# Patient Record
Sex: Male | Born: 1955 | Race: White | Hispanic: No | Smoking: Former smoker
Health system: Southern US, Community
[De-identification: ages and names within clinical notes are randomized; demographics above are authoritative.]

## PROBLEM LIST (undated history)

## (undated) DIAGNOSIS — G4730 Sleep apnea, unspecified: Secondary | ICD-10-CM

## (undated) DIAGNOSIS — I10 Essential (primary) hypertension: Secondary | ICD-10-CM

## (undated) DIAGNOSIS — E079 Disorder of thyroid, unspecified: Secondary | ICD-10-CM

## (undated) DIAGNOSIS — J45909 Unspecified asthma, uncomplicated: Secondary | ICD-10-CM

## (undated) DIAGNOSIS — E119 Type 2 diabetes mellitus without complications: Secondary | ICD-10-CM

## (undated) DIAGNOSIS — F1910 Other psychoactive substance abuse, uncomplicated: Secondary | ICD-10-CM

## (undated) DIAGNOSIS — Z9109 Other allergy status, other than to drugs and biological substances: Secondary | ICD-10-CM

## (undated) HISTORY — DX: Unspecified asthma, uncomplicated: J45.909

## (undated) HISTORY — DX: Disorder of thyroid, unspecified: E07.9

## (undated) HISTORY — DX: Essential (primary) hypertension: I10

## (undated) HISTORY — DX: Sleep apnea, unspecified: G47.30

## (undated) HISTORY — DX: Other allergy status, other than to drugs and biological substances: Z91.09

## (undated) HISTORY — DX: Type 2 diabetes mellitus without complications: E11.9

## (undated) HISTORY — DX: Other psychoactive substance abuse, uncomplicated: F19.10

## (undated) HISTORY — PX: ROTATOR CUFF REPAIR: SHX139

---

## 2005-03-30 ENCOUNTER — Observation Stay (HOSPITAL_COMMUNITY): Admission: RE | Admit: 2005-03-30 | Discharge: 2005-03-31 | Payer: Self-pay | Admitting: Orthopedic Surgery

## 2016-06-27 ENCOUNTER — Encounter: Payer: Self-pay | Admitting: Internal Medicine

## 2016-07-26 ENCOUNTER — Ambulatory Visit (AMBULATORY_SURGERY_CENTER): Payer: Self-pay

## 2016-07-26 VITALS — Ht 66.0 in | Wt 181.6 lb

## 2016-07-26 DIAGNOSIS — Z1211 Encounter for screening for malignant neoplasm of colon: Secondary | ICD-10-CM

## 2016-07-26 NOTE — Progress Notes (Signed)
No allergies to eggs or soy No past problems with anesthesia No diet meds No home oxygen  Has email and internet registered for emmi 

## 2016-07-31 ENCOUNTER — Encounter: Payer: Self-pay | Admitting: Internal Medicine

## 2016-08-08 ENCOUNTER — Telehealth: Payer: Self-pay | Admitting: Internal Medicine

## 2016-08-08 NOTE — Telephone Encounter (Signed)
No charge. 

## 2016-08-09 ENCOUNTER — Encounter: Payer: BLUE CROSS/BLUE SHIELD | Admitting: Internal Medicine

## 2016-10-17 ENCOUNTER — Ambulatory Visit (AMBULATORY_SURGERY_CENTER): Payer: BLUE CROSS/BLUE SHIELD | Admitting: Internal Medicine

## 2016-10-17 ENCOUNTER — Encounter: Payer: Self-pay | Admitting: Internal Medicine

## 2016-10-17 VITALS — BP 129/66 | HR 82 | Temp 97.3°F | Resp 21 | Ht 66.0 in | Wt 181.0 lb

## 2016-10-17 DIAGNOSIS — Z1211 Encounter for screening for malignant neoplasm of colon: Secondary | ICD-10-CM | POA: Diagnosis not present

## 2016-10-17 MED ORDER — SODIUM CHLORIDE 0.9 % IV SOLN: 500.0000 mL | INTRAVENOUS | Status: AC

## 2016-10-17 NOTE — Patient Instructions (Addendum)
No polyps or cancer identified. You do have diverticulosis - thickened muscle rings and pouches in the colon wall. Please read the handout about this condition.  Next routine colonoscopy/screening test in 10 years - 2027-8  I appreciate the opportunity to care for you. Iva Booparl E. Xion Debruyne, MD, FACG     YOU HAD AN ENDOSCOPIC PROCEDURE TODAY AT THE [facility]:   Refer to the procedure report that was given to you for any specific questions about what was found during the examination.  If the procedure report does not answer your questions, please call your gastroenterologist to clarify.  If you requested that your care partner not be given the details of your procedure findings, then the procedure report has been included in a sealed envelope for you to review at your convenience later.  YOU SHOULD EXPECT: Some feelings of bloating in the abdomen. Passage of more gas than usual.  Walking can help get rid of the air that was put into your GI tract during the procedure and reduce the bloating. If you had a lower endoscopy (such as a colonoscopy or flexible sigmoidoscopy) you may notice spotting of blood in your stool or on the toilet paper. If you underwent a bowel prep for your procedure, you may not have a normal bowel movement for a few days.  Please Note:  You might notice some irritation and congestion in your nose or some drainage.  This is from the oxygen used during your procedure.  There is no need for concern and it should clear up in a day or so.  SYMPTOMS TO REPORT IMMEDIATELY:   Following lower endoscopy (colonoscopy or flexible sigmoidoscopy):  Excessive amounts of blood in the stool  Significant tenderness or worsening of abdominal pains  Swelling of the abdomen that is new, acute  Fever of 100F or higher   Following upper endoscopy (EGD)  Vomiting of blood or coffee ground material  New chest pain or pain under the shoulder blades  Painful or persistently  difficult swallowing  New shortness of breath  Fever of 100F or higher  Black, tarry-looking stools  For urgent or emergent issues, a gastroenterologist can be reached at any hour by calling [phone].   DIET:  We do recommend a small meal at first, but then you may proceed to your regular diet.  Drink plenty of fluids but you should avoid alcoholic beverages for 24 hours.  ACTIVITY:  You should plan to take it easy for the rest of today and you should NOT DRIVE or use heavy machinery until tomorrow (because of the sedation medicines used during the test).    FOLLOW UP: Our staff will call the number listed on your records the next business day following your procedure to check on you and address any questions or concerns that you may have regarding the information given to you following your procedure. If we do not reach you, we will leave a message.  However, if you are feeling well and you are not experiencing any problems, there is no need to return our call.  We will assume that you have returned to your regular daily activities without incident.  If any biopsies were taken you will be contacted by phone or by letter within the next 1-3 weeks.  Please call us at [phone] if you have not heard about the biopsies in 3 weeks.    SIGNATURES/CONFIDENTIALITY: You and/or your care partner have signed paperwork which will be entered into  your electronic medical record.  These signatures attest to the fact that that the information above on your After Visit Summary has been reviewed and is understood.  Full responsibility of the confidentiality of this discharge information lies with you and/or your care-partner.   Handout was given to your care partner on diverticulosis. You may resume your current medications today. Repeat next screening colonoscopy in 10 years. Please call if any questions or concerns.

## 2016-10-17 NOTE — Progress Notes (Signed)
Oncall Note Patient called at 7 am, he didn't wake up at 3:30 AM, overslept and missed drinking the 2nd half of prep. He is scheduled for 8:30 am. He drank the first half as recommended and is having liquid light brown stool.  Advised him to come in this AM, may need enema prior to the procedure K. Scherry RanVeena Nandigam , MD [phone] Mon-Fri 8a-5p [phone] after 5p, weekends, holidays

## 2016-10-17 NOTE — Op Note (Signed)
[facility] Patient Name: Marvin Tucker Procedure Date: 10/17/2016 7:40 AM MRN: [id number] Endoscopist: Iva Booparl E Gessner , MD Age: 60 Referring MD:  Date of Birth: 14-Sep-1956 Gender: Male Account #: [id number] Procedure:                Colonoscopy Indications:              Screening for colorectal malignant neoplasm Medicines:                Monitored Anesthesia Care Procedure:                Pre-Anesthesia Assessment:                           - Prior to the procedure, a History and Physical                            was performed, and patient medications and                            allergies were reviewed. The patient's tolerance of                            previous anesthesia was also reviewed. The risks                            and benefits of the procedure and the sedation                            options and risks were discussed with the patient.                            All questions were answered, and informed consent                            was obtained. Prior Anticoagulants: The patient has                            taken no previous anticoagulant or antiplatelet                            agents. ASA Grade Assessment: II - A patient with                            mild systemic disease. After reviewing the risks                            and benefits, the patient was deemed in                            satisfactory condition to undergo the procedure.                           After obtaining informed consent, the colonoscope  was passed under direct vision. Throughout the                            procedure, the patient's blood pressure, pulse, and                            oxygen saturations were monitored continuously. The                            [id number] ([id number]) was introduced through the anus                            and advanced to the the cecum, identified by                            appendiceal  orifice and ileocecal valve. The                            colonoscopy was performed without difficulty. The                            patient tolerated the procedure well. The quality                            of the bowel preparation was adequate. The bowel                            preparation used was Miralax. The ileocecal valve,                            appendiceal orifice, and rectum were photographed. Scope In: 8:17:23 AM Scope Out: 8:38:05 AM Scope Withdrawal Time: 0 hours 16 minutes 27 seconds  Total Procedure Duration: 0 hours 20 minutes 42 seconds  Findings:                 The perianal and digital rectal examinations were                            normal. Pertinent negatives include normal prostate                            (size, shape, and consistency).                           Multiple small and large-mouthed diverticula were                            found in the entire colon. Estimated blood loss:                            none.                           The exam was otherwise without abnormality on  direct and retroflexion views. Complications:            No immediate complications. Estimated Blood Loss:     Estimated blood loss: none. Impression:               - Diverticulosis in the entire examined colon.                           - The examination was otherwise normal on direct                            and retroflexion views.                           - No specimens collected. Recommendation:           - Patient has a contact number available for                            emergencies. The signs and symptoms of potential                            delayed complications were discussed with the                            patient. Return to normal activities tomorrow.                            Written discharge instructions were provided to the                            patient.                           - Resume previous  diet.                           - Continue present medications.                           - Repeat colonoscopy/screening test in 10 years for                            screening purposes. Iva Boop, MD 10/17/2016 8:48:58 AM This report has been signed electronically.

## 2016-10-17 NOTE — Progress Notes (Signed)
No problems noted in the recovery room. maw 

## 2016-10-18 ENCOUNTER — Telehealth: Payer: Self-pay | Admitting: *Deleted

## 2016-10-18 NOTE — Telephone Encounter (Signed)
  Follow up Call-  Call back number 10/17/2016  Post procedure Call Back phone  # [phone]  Permission to leave phone message Yes  Some recent data might be hidden     No answer at # given. Left message on voicemail.

## 2016-10-18 NOTE — Telephone Encounter (Signed)
  Follow up Call-  Call back number 10/17/2016  Post procedure Call Back phone  # [phone]  Permission to leave phone message Yes  Some recent data might be hidden     2nd attempt to reach patient for follow up phone call with no answer.  No message left.

## 2018-03-28 ENCOUNTER — Encounter (HOSPITAL_COMMUNITY): Payer: Self-pay

## 2018-03-28 ENCOUNTER — Other Ambulatory Visit: Payer: Self-pay

## 2018-03-28 ENCOUNTER — Emergency Department (HOSPITAL_COMMUNITY)
Admission: EM | Admit: 2018-03-28 | Discharge: 2018-03-28 | Disposition: A | Discharge status: Home / Self Care | Payer: BLUE CROSS/BLUE SHIELD | Attending: Emergency Medicine | Admitting: Emergency Medicine

## 2018-03-28 DIAGNOSIS — J45909 Unspecified asthma, uncomplicated: Secondary | ICD-10-CM | POA: Insufficient documentation

## 2018-03-28 DIAGNOSIS — L03211 Cellulitis of face: Secondary | ICD-10-CM

## 2018-03-28 DIAGNOSIS — Z87891 Personal history of nicotine dependence: Secondary | ICD-10-CM | POA: Insufficient documentation

## 2018-03-28 DIAGNOSIS — I10 Essential (primary) hypertension: Secondary | ICD-10-CM | POA: Insufficient documentation

## 2018-03-28 DIAGNOSIS — Z7984 Long term (current) use of oral hypoglycemic drugs: Secondary | ICD-10-CM | POA: Insufficient documentation

## 2018-03-28 DIAGNOSIS — Z79899 Other long term (current) drug therapy: Secondary | ICD-10-CM | POA: Insufficient documentation

## 2018-03-28 DIAGNOSIS — E119 Type 2 diabetes mellitus without complications: Secondary | ICD-10-CM | POA: Insufficient documentation

## 2018-03-28 MED ORDER — DOXYCYCLINE HYCLATE 100 MG PO CAPS: 100.0000 mg | ORAL_CAPSULE | Freq: Two times a day (BID) | ORAL | 0 refills | Status: AC

## 2018-03-28 NOTE — Discharge Instructions (Addendum)
You may have diarrhea from the antibiotics.  It is very important that you continue to take the antibiotics even if you get diarrhea unless a medical professional tells you that you may stop taking them.  If you stop too early the bacteria you are being treated for will become stronger and you may need different, more powerful antibiotics that have more side effects and worsening diarrhea.  Please stay well hydrated and consider probiotics as they may decrease the severity of your diarrhea.    Doxycycline can make you sensitive to the sun and more likely to burn.  Please make sure if outside you cover up and wear sunscreen.   You develop fevers, nausea/vomiting, visual changes, headache, or have any worsening symptoms or concerns then please seek additional medical care and evaluation.  Please follow-up with your primary care provider on either Monday or Tuesday for a recheck.

## 2018-03-28 NOTE — ED Notes (Signed)
ED Provider at bedside. 

## 2018-03-28 NOTE — ED Provider Notes (Signed)
patient with 2 small "pimples" his forehead a few days ago which he squeezed.  Lesions have since become red or more swollen and somewhat painful.  He denies fever denies feeling ill denies pain with extraocular.  Suspect localized infection   Doug SouJacubowitz, Marthena Whitmyer, MD 03/28/18 1511

## 2018-03-28 NOTE — ED Triage Notes (Signed)
Pt endorses possibly being bit by a spider Sunday night, woke up with 2 small bite marks on his forehead Monday morning. Swelling has increased slowly since then. VSS.

## 2018-03-28 NOTE — ED Provider Notes (Signed)
[facility]   CSN: [id number] Arrival date & time: 03/28/18  1222     History   Chief Complaint Chief Complaint  Patient presents with  . Insect Bite    HPI Marvin Tucker is a 62 y.o. male with a past medical history of substance abuse, diabetes, hypertension, who presents today for evaluation of facial swelling.  He reports that on Monday he noticed 2 small red dots in the center of his forehead between his eyes, these progressed into looking like small pimples which he then squeezed on Wednesday.  He reports that since then he has had worsening swelling on his forehead, and when he woke up this morning he had swelling under his bilateral eyelids, right worse than left.  He denies any allergies to antibiotics.  He initially reported that it was a spider, however did not see a spider in the area.  Last tetanus within the past 5 years.  No fevers or chills.  No blurry vision or changes in vision.  He denies any recent trauma.  HPI  Past Medical History:  Diagnosis Date  . Asthma   . Diabetes mellitus (HCC)   . Environmental allergies   . Hypertension   . Sleep apnea    no CPAP sleep study 2002  . Substance abuse (HCC)   . Thyroid disease     There are no active problems to display for this patient.   Past Surgical History:  Procedure Laterality Date  . ROTATOR CUFF REPAIR     right        Home Medications    Prior to Admission medications   Medication Sig Start Date End Date Taking? Authorizing Provider  doxycycline (VIBRAMYCIN) 100 MG capsule Take 1 capsule (100 mg total) by mouth 2 (two) times daily for 7 days. 03/28/18 04/04/18  Cristina GongHammond, Terriah Reggio W, PA-C  fexofenadine (ALLEGRA) 180 MG tablet Take 180 mg by mouth daily.    [provider]  fluticasone furoate-vilanterol (BREO ELLIPTA) 100-25 MCG/INH AEPB Inhale 1 puff into the lungs daily.    [provider]  ipratropium (ATROVENT HFA) 17  MCG/ACT inhaler Inhale 2 puffs into the lungs every 4 (four) hours as needed for wheezing.    [provider]  levothyroxine (SYNTHROID, LEVOTHROID) 112 MCG tablet Take 112 mcg by mouth daily before breakfast.    [provider]  lisinopril-hydrochlorothiazide (PRINZIDE,ZESTORETIC) 20-25 MG tablet Take 1 tablet by mouth daily.    [provider]  metFORMIN (GLUCOPHAGE) 500 MG tablet Take by mouth 2 (two) times daily with a meal.    [provider]  Multiple Vitamin (MULTIVITAMIN) tablet Take 1 tablet by mouth daily.    [provider]  Omega-3 Fatty Acids (FISH OIL CONCENTRATE PO) Take 3 capsules by mouth.    [provider]    Family History Family History  Problem Relation Age of Onset  . Colon cancer Neg Hx     Social History Social History   Tobacco Use  . Smoking status: Former Smoker    Types: Cigarettes    Last attempt to quit: 10/22/1994    Years since quitting: 23.4  . Smokeless tobacco: Never Used  Substance Use Topics  . Alcohol use: No  . Drug use: Yes    Types: "Crack" cocaine    Comment: history of cocaine use      Allergies   Patient has no known allergies.   Review of Systems Review of Systems  Constitutional: Negative for chills and fever.  HENT: Positive for facial swelling.   Respiratory: Negative for shortness of breath.   Gastrointestinal: Negative for nausea.  Neurological: Negative for weakness and headaches.  All other systems reviewed and are negative.    Physical Exam Updated Vital Signs BP 129/85 (BP Location: Right Arm)   Pulse 61   Temp 97.8 F (36.6 C) (Oral)   Resp 16   Ht 5\' 6"  (1.676 m)   Wt 83.9 kg (185 lb)   SpO2 100%   BMI 29.86 kg/m   Physical Exam  Constitutional: He is oriented to person, place, and time. He appears well-developed and well-nourished.  HENT:  Erythema and swelling over the anterior lower forehead between eyebrows.  There is generalized induration  without fluctuance or drainage.  Slight swelling under bilateral eyes, left worse than right.  Please see clinical images.  Eyes: Pupils are equal, round, and reactive to light. EOM are normal.  No pain with extraocular range of motion.  Neck: Normal range of motion. Neck supple.  Neurological: He is alert and oriented to person, place, and time.  Skin: Skin is warm and dry. He is not diaphoretic.  Psychiatric: He has a normal mood and affect.  Nursing note and vitals reviewed.        ED Treatments / Results  Labs (all labs ordered are listed, but only abnormal results are displayed) Labs Reviewed - No data to display  EKG None  Radiology No results found.  Procedures Procedures (including critical care time)  Medications Ordered in ED Medications - No data to display   Initial Impression / Assessment and Plan / ED Course  I have reviewed the triage vital signs and the nursing notes.  Pertinent labs & imaging results that were available during my care of the patient were reviewed by me and considered in my medical decision making (see chart for details).     She presents today for redness and swelling of his forehead with concern for swelling that has spread to his bilateral under eyes.  There is no obvious drainable abscess, no fluctuance to the area.  Suspect superficial cellulitis.  Patient is without fevers or chills, he does not meet Sirs or sepsis criteria.  Will treat patient with doxycycline.  Strict return precautions were discussed with patient, including worsening of swelling/redness, systemic symptoms, and any other concerns.  Patient states his understanding.  Patient appears safe for a outpatient trial of antibiotics with close follow-up.  Instructed to follow-up with his primary care provider Monday or back in the emergency room sooner if needed.   This patient seen as a shared visit with Dr. Ethelda Chick who evaluated the patient.    Final Clinical  Impressions(s) / ED Diagnoses   Final diagnoses:  Cellulitis of face    ED Discharge Orders        Ordered    doxycycline (VIBRAMYCIN) 100 MG capsule  2 times daily     03/28/18 1446       Cristina Gong, [place] 03/28/18 1700    Doug Sou, MD 03/28/18 [phone]

## 2021-01-28 ENCOUNTER — Encounter (HOSPITAL_COMMUNITY): Payer: Self-pay | Admitting: Emergency Medicine

## 2021-01-28 ENCOUNTER — Emergency Department (HOSPITAL_COMMUNITY): Payer: Self-pay

## 2021-01-28 ENCOUNTER — Emergency Department (HOSPITAL_COMMUNITY)
Admission: EM | Admit: 2021-01-28 | Discharge: 2021-01-28 | Disposition: A | Discharge status: Home / Self Care | Payer: Self-pay | Attending: Emergency Medicine | Admitting: Emergency Medicine

## 2021-01-28 DIAGNOSIS — Z7984 Long term (current) use of oral hypoglycemic drugs: Secondary | ICD-10-CM | POA: Insufficient documentation

## 2021-01-28 DIAGNOSIS — I10 Essential (primary) hypertension: Secondary | ICD-10-CM | POA: Insufficient documentation

## 2021-01-28 DIAGNOSIS — E119 Type 2 diabetes mellitus without complications: Secondary | ICD-10-CM | POA: Insufficient documentation

## 2021-01-28 DIAGNOSIS — I880 Nonspecific mesenteric lymphadenitis: Secondary | ICD-10-CM | POA: Insufficient documentation

## 2021-01-28 DIAGNOSIS — Z79899 Other long term (current) drug therapy: Secondary | ICD-10-CM | POA: Insufficient documentation

## 2021-01-28 DIAGNOSIS — J45909 Unspecified asthma, uncomplicated: Secondary | ICD-10-CM | POA: Insufficient documentation

## 2021-01-28 DIAGNOSIS — N23 Unspecified renal colic: Secondary | ICD-10-CM | POA: Insufficient documentation

## 2021-01-28 DIAGNOSIS — Z87891 Personal history of nicotine dependence: Secondary | ICD-10-CM | POA: Insufficient documentation

## 2021-01-28 LAB — URINALYSIS, ROUTINE W REFLEX MICROSCOPIC
Bilirubin Urine: NEGATIVE
Glucose, UA: NEGATIVE mg/dL
Ketones, ur: 5 mg/dL — AB
Leukocytes,Ua: NEGATIVE
Nitrite: NEGATIVE
Protein, ur: 30 mg/dL — AB
RBC / HPF: >50 RBC/hpf — ABNORMAL HIGH (ref 0–5)
Specific Gravity, Urine: 1.027 (ref 1.005–1.030)
pH: 5 (ref 5.0–8.0)

## 2021-01-28 LAB — CBC WITH DIFFERENTIAL/PLATELET
Abs Immature Granulocytes: 0.12 10*3/uL — ABNORMAL HIGH (ref 0.00–0.07)
Basophils Absolute: 0 10*3/uL (ref 0.0–0.1)
Basophils Relative: 0 %
Eosinophils Absolute: 0.1 10*3/uL (ref 0.0–0.5)
Eosinophils Relative: 0 %
HCT: 34.6 % — ABNORMAL LOW (ref 39.0–52.0)
Hemoglobin: 11.6 g/dL — ABNORMAL LOW (ref 13.0–17.0)
Immature Granulocytes: 1 %
Lymphocytes Relative: 7 %
Lymphs Abs: 1.2 10*3/uL (ref 0.7–4.0)
MCH: 28.9 pg (ref 26.0–34.0)
MCHC: 33.5 g/dL (ref 30.0–36.0)
MCV: 86.1 fL (ref 80.0–100.0)
Monocytes Absolute: 0.7 10*3/uL (ref 0.1–1.0)
Monocytes Relative: 4 %
Neutro Abs: 14.8 10*3/uL — ABNORMAL HIGH (ref 1.7–7.7)
Neutrophils Relative %: 88 %
Platelets: 60 10*3/uL — ABNORMAL LOW (ref 150–400)
RBC: 4.02 MIL/uL — ABNORMAL LOW (ref 4.22–5.81)
RDW: 14.3 % (ref 11.5–15.5)
WBC: 17 10*3/uL — ABNORMAL HIGH (ref 4.0–10.5)
nRBC: 0 % (ref 0.0–0.2)

## 2021-01-28 LAB — COMPREHENSIVE METABOLIC PANEL
ALT: 23 U/L (ref 0–44)
AST: 21 U/L (ref 15–41)
Albumin: 4.1 g/dL (ref 3.5–5.0)
Alkaline Phosphatase: 50 U/L (ref 38–126)
Anion gap: 10 (ref 5–15)
BUN: 23 mg/dL (ref 8–23)
CO2: 27 mmol/L (ref 22–32)
Calcium: 9.3 mg/dL (ref 8.9–10.3)
Chloride: 101 mmol/L (ref 98–111)
Creatinine, Ser: 1.13 mg/dL (ref 0.61–1.24)
GFR, Estimated: >60 mL/min (ref >60)
Glucose, Bld: 180 mg/dL — ABNORMAL HIGH (ref 70–99)
Potassium: 4.5 mmol/L (ref 3.5–5.1)
Sodium: 138 mmol/L (ref 135–145)
Total Bilirubin: 0.8 mg/dL (ref 0.3–1.2)
Total Protein: 6.9 g/dL (ref 6.5–8.1)

## 2021-01-28 LAB — LIPASE, BLOOD: Lipase: 43 U/L (ref 11–51)

## 2021-01-28 MED ORDER — ONDANSETRON HCL 4 MG/2ML IJ SOLN
4.0000 mg | Freq: Once | INTRAMUSCULAR | Status: AC
  Administered 2021-01-28: 4 mg via INTRAVENOUS
  Filled 2021-01-28: qty 2

## 2021-01-28 MED ORDER — FENTANYL CITRATE (PF) 100 MCG/2ML IJ SOLN
50.0000 ug | Freq: Once | INTRAMUSCULAR | Status: AC
  Administered 2021-01-28: 50 ug via INTRAVENOUS
  Filled 2021-01-28: qty 2

## 2021-01-28 MED ORDER — SODIUM CHLORIDE 0.9 % IV BOLUS
1000.0000 mL | Freq: Once | INTRAVENOUS | Status: AC
  Administered 2021-01-28: 1000 mL via INTRAVENOUS

## 2021-01-28 MED ORDER — KETOROLAC TROMETHAMINE 15 MG/ML IJ SOLN
15.0000 mg | Freq: Once | INTRAMUSCULAR | Status: AC
  Administered 2021-01-28: 15 mg via INTRAVENOUS
  Filled 2021-01-28: qty 1

## 2021-01-28 MED ORDER — CEPHALEXIN 500 MG PO CAPS: 500.0000 mg | ORAL_CAPSULE | Freq: Three times a day (TID) | ORAL | 0 refills | Status: AC

## 2021-01-28 MED ORDER — CEPHALEXIN 500 MG PO CAPS
500.0000 mg | ORAL_CAPSULE | Freq: Once | ORAL | Status: AC
  Administered 2021-01-28: 500 mg via ORAL
  Filled 2021-01-28: qty 1

## 2021-01-28 NOTE — ED Triage Notes (Signed)
Pt BIB [facility] from home for LLQ abdominal pain. Denies vomiting. Took ibuprofen PTA. Pain started around 6p last night. CBG 224.

## 2021-01-28 NOTE — ED Provider Notes (Signed)
[facility]-EMERGENCY DEPT Provider Note  CSN: [id number] Arrival date & time: 01/28/21 0117  Chief Complaint(s) Abdominal Pain  HPI Marvin Tucker is a 65 y.o. male with a past medical history listed below who presents to the emergency department with 2 days of fluctuating left-sided abdominal discomfort radiating from the flank to the groin.  Patient reports that the pain has been improving with over-the-counter Motrin however tonight's episode was more severe and intense.  He endorses nausea without emesis.  No diarrhea.  No fevers or chills.  No urinary symptoms.  Denies any other physical complaints.  HPI  Past Medical History Past Medical History:  Diagnosis Date  . Asthma   . Diabetes mellitus (HCC)   . Environmental allergies   . Hypertension   . Sleep apnea    no CPAP sleep study 2002  . Substance abuse (HCC)   . Thyroid disease    There are no problems to display for this patient.  Home Medication(s) Prior to Admission medications   Medication Sig Start Date End Date Taking? Authorizing Provider  Ascorbic Acid (VITAMIN C) 100 MG tablet Take 100 mg by mouth daily.   Yes [provider]  atorvastatin (LIPITOR) 40 MG tablet Take 1 tablet by mouth at bedtime. 01/24/21  Yes [provider]  cephALEXin (KEFLEX) 500 MG capsule Take 1 capsule (500 mg total) by mouth 3 (three) times daily for 5 days. 01/28/21 02/02/21 Yes Sharanda Shinault, Amadeo Garnet, MD  EUTHYROX 125 MCG tablet Take 125 mcg by mouth daily. 01/24/21  Yes [provider]  FEROSUL 325 (65 Fe) MG tablet Take 325 mg by mouth daily. 01/24/21  Yes [provider]  fluticasone furoate-vilanterol (BREO ELLIPTA) 100-25 MCG/INH AEPB Inhale 1 puff into the lungs daily.   Yes [provider]  glipiZIDE (GLUCOTROL) 10 MG tablet Take 10 mg by mouth daily. 01/24/21  Yes [provider]  ipratropium (ATROVENT HFA) 17 MCG/ACT inhaler Inhale 2 puffs into the lungs every  4 (four) hours as needed for wheezing.   Yes [provider]  lisinopril-hydrochlorothiazide (PRINZIDE,ZESTORETIC) 20-25 MG tablet Take 1.5 tablets by mouth daily.   Yes [provider]  metFORMIN (GLUCOPHAGE-XR) 500 MG 24 hr tablet Take 1,000 mg by mouth 2 (two) times daily. 01/24/21  Yes [provider]  tamsulosin (FLOMAX) 0.4 MG CAPS capsule Take 0.4 mg by mouth at bedtime. 01/24/21  Yes [provider]  TURMERIC PO Take 1 tablet by mouth daily. 01/12/21  Yes [provider]                                                                                                                                    Past Surgical History Past Surgical History:  Procedure Laterality Date  . ROTATOR CUFF REPAIR     right   Family History Family History  Problem Relation Age of Onset  . Colon cancer Neg  Hx     Social History Social History   Tobacco Use  . Smoking status: Former Smoker    Types: Cigarettes    Quit date: 10/22/1994    Years since quitting: 26.2  . Smokeless tobacco: Never Used  Substance Use Topics  . Alcohol use: No  . Drug use: Yes    Types: "Crack" cocaine    Comment: history of cocaine use    Allergies Patient has no known allergies.  Review of Systems Review of Systems All other systems are reviewed and are negative for acute change except as noted in the HPI  Physical Exam Vital Signs  I have reviewed the triage vital signs BP (!) 180/86   Pulse 65   Temp (!) 97.5 F (36.4 C) (Oral)   Resp 16   Ht 5\' 6"  (1.676 m)   Wt 90.7 kg   SpO2 96%   BMI 32.28 kg/m   Physical Exam Vitals reviewed.  Constitutional:      General: He is not in acute distress.    Appearance: He is well-developed. He is not diaphoretic.  HENT:     Head: Normocephalic and atraumatic.     Jaw: No trismus.     Right Ear: External ear normal.     Left Ear: External ear normal.     Nose: Nose normal.  Eyes:     General: No scleral icterus.     Conjunctiva/sclera: Conjunctivae normal.  Neck:     Trachea: Phonation normal.  Cardiovascular:     Rate and Rhythm: Normal rate and regular rhythm.  Pulmonary:     Effort: Pulmonary effort is normal. No respiratory distress.     Breath sounds: No stridor.  Abdominal:     General: There is no distension.     Tenderness: There is no abdominal tenderness. There is no right CVA tenderness, left CVA tenderness, guarding or rebound.  Musculoskeletal:        General: Normal range of motion.     Cervical back: Normal range of motion.  Neurological:     Mental Status: He is alert and oriented to person, place, and time.  Psychiatric:        Behavior: Behavior normal.     ED Results and Treatments Labs (all labs ordered are listed, but only abnormal results are displayed) Labs Reviewed  COMPREHENSIVE METABOLIC PANEL - Abnormal; Notable for the following components:      Result Value   Glucose, Bld 180 (*)    All other components within normal limits  CBC WITH DIFFERENTIAL/PLATELET - Abnormal; Notable for the following components:   WBC 17.0 (*)    RBC 4.02 (*)    Hemoglobin 11.6 (*)    HCT 34.6 (*)    Platelets 60 (*)    Neutro Abs 14.8 (*)    Abs Immature Granulocytes 0.12 (*)    All other components within normal limits  URINALYSIS, ROUTINE W REFLEX MICROSCOPIC - Abnormal; Notable for the following components:   Hgb urine dipstick MODERATE (*)    Ketones, ur 5 (*)    Protein, ur 30 (*)    RBC / HPF >50 (*)    Bacteria, UA RARE (*)    All other components within normal limits  URINE CULTURE  LIPASE, BLOOD  EKG  EKG Interpretation  Date/Time:    Ventricular Rate:    PR Interval:    QRS Duration:   QT Interval:    QTC Calculation:   R Axis:     Text Interpretation:        Radiology CT RENAL STONE STUDY  Result Date: 01/28/2021 CLINICAL DATA:   Flank and left lower quadrant pain EXAM: CT ABDOMEN AND PELVIS WITHOUT CONTRAST TECHNIQUE: Multidetector CT imaging of the abdomen and pelvis was performed following the standard protocol without IV contrast. COMPARISON:  None. FINDINGS: Lower chest: Lung bases demonstrate no acute consolidation or effusion. Focal pleural and parenchymal scarring at the right middle lobe. Normal cardiac size. Hepatobiliary: No focal liver abnormality is seen. No gallstones, gallbladder wall thickening, or biliary dilatation. Pancreas: Unremarkable. No pancreatic ductal dilatation or surrounding inflammatory changes. Spleen: Normal in size without focal abnormality. Adrenals/Urinary Tract: Adrenal glands are normal. Probable cyst in the mid right kidney. Mild to moderate left hy hydronephrosis, secondary to a 3 mm stone in the proximal left ureter about 3 cm distal to the left UPJ. The urinary bladder is unremarkable. Stomach/Bowel: Stomach is within normal limits. Appendix appears normal. No evidence of bowel wall thickening, distention, or inflammatory changes. Vascular/Lymphatic: Mild aortic atherosclerosis. No aneurysm. Mild hazy density within the central mesentery with multiple lymph nodes which are subcentimeter. Reproductive: Prostate is unremarkable. Other: Negative for free air or free fluid. Moderate fat containing umbilical hernia. Musculoskeletal: No acute or significant osseous findings. IMPRESSION: 1. Mild to moderate left hydronephrosis, secondary to a 3 mm stone in the proximal left ureter about 3 cm distal to the left UPJ. 2. Moderate fat containing umbilical hernia. 3. Hazy infiltration of the central mesentery with multiple small lymph nodes suggestive of mesenteritis or panniculitis. Aortic Atherosclerosis (ICD10-I70.0). Electronically Signed   By: Jasmine Pang M.D.   On: 01/28/2021 02:55    Pertinent labs & imaging results that were available during my care of the patient were reviewed by me and considered  in my medical decision making (see chart for details).  Medications Ordered in ED Medications  cephALEXin (KEFLEX) capsule 500 mg (has no administration in time range)  fentaNYL (SUBLIMAZE) injection 50 mcg (50 mcg Intravenous Given 01/28/21 0347)  ondansetron (ZOFRAN) injection 4 mg (4 mg Intravenous Given 01/28/21 0346)  ketorolac (TORADOL) 15 MG/ML injection 15 mg (15 mg Intravenous Given 01/28/21 0621)  sodium chloride 0.9 % bolus 1,000 mL (1,000 mLs Intravenous New Bag/Given 01/28/21 0622)                                                                                                                                    Procedures Procedures  (including critical care time)  Medical Decision Making / ED Course I have reviewed the nursing notes for this encounter and the patient's prior records (if available in EHR or on provided paperwork).   Sheryn Bison was evaluated  in Emergency Department on 01/28/2021 for the symptoms described in the history of present illness. He was evaluated in the context of the global COVID-19 pandemic, which necessitated consideration that the patient might be at risk for infection with the SARS-CoV-2 virus that causes COVID-19. Institutional protocols and algorithms that pertain to the evaluation of patients at risk for COVID-19 are in a state of rapid change based on information released by regulatory bodies including the CDC and federal and state organizations. These policies and algorithms were followed during the patient's care in the ED.  Left-sided flank pain. No suspicion for urinary colic. CT stone study confirmed a 3 mm left UVJ stone with mild hydronephrosis.  Labs reassuring without AKI.  Patient does have leukocytosis which could be demargination.  He is afebrile.  CT scan was notable for mesenteric lymphadenopathy the patient denies any recent GI related infections.  He was instructed to follow-up closely with his primary care provider for repeat  imaging with contrast to assess for resolution or progression.  UA not convincing for UTI, but given leukocytosis will cover with keflex and send for culture..  Pain control.  Tolerating oral intake.      Final Clinical Impression(s) / ED Diagnoses Final diagnoses:  Renal colic on left side  Nonspecific mesenteric adenitis    The patient appears reasonably screened and/or stabilized for discharge and I doubt any other medical condition or other [facility] requiring further screening, evaluation, or treatment in the ED at this time prior to discharge. Safe for discharge with strict return precautions.  Disposition: Discharge  Condition: Good  I have discussed the results, Dx and Tx plan with the patient/family who expressed understanding and agree(s) with the plan. Discharge instructions discussed at length. The patient/family was given strict return precautions who verbalized understanding of the instructions. No further questions at time of discharge.    ED Discharge Orders         Ordered    cephALEXin (KEFLEX) 500 MG capsule  3 times daily        01/28/21 0708          Follow Up: Dartha Lodge, FNP [address] [place] [address] [phone]  Call  to schedule an appointment for close follow up to reassess the swollen lympnodes near your intestines     This chart was dictated using voice recognition software.  Despite best efforts to proofread,  errors can occur which can change the documentation meaning.   Nira Conn, MD 01/28/21 [phone]

## 2021-01-28 NOTE — Discharge Instructions (Signed)
You can take 600 mg of Motrin every 6-8 hours for pain control.

## 2021-01-28 NOTE — ED Provider Notes (Signed)
MSE was initiated and I personally evaluated the patient and placed orders (if any) at  2:15 AM on January 28, 2021.  The patient appears stable so that the remainder of the MSE may be completed by another provider.  HPI:  Marvin Tucker is a 65 y.o. male presents to the emergency department with complaints of left flank pain that radiates into the left side of the abdomen onset approximately 24 hours ago.  Patient denies fever, chills, nausea, vomiting.  Reports taking Motrin with minor relief however pain returns.  ROS:  Positive: left flank pain, abdominal pain Negative: Nausea, vomiting, diarrhea, dysuria, syncope  PE:  BP (!) 199/86 (BP Location: Right Arm)   Pulse 67   Temp (!) 97.5 F (36.4 C) (Oral)   Resp 16   Ht 5\' 6"  (1.676 m)   Wt 90.7 kg   SpO2 98%   BMI 32.28 kg/m   General: Awake,  HEENT: Atraumatic  Resp: Normal effort  Cardiac: normal rate Abd: Nondistended, nontender, no CVA tenderness MSK: moves all extremities without difficulty Neuro: speech clear   MDM: MSE completed and orders placed as needed.    Discussed with the patient that exiting the department prior to completion of the work-up is AMA and there is no guarantee that there are no emergency medical conditions present. Pt states understanding.      Hayleen Clinkscales, 01/28/21 0217    03/30/21, MD 01/29/21 [phone]

## 2021-01-29 LAB — URINE CULTURE: Culture: NO GROWTH

## 2022-03-26 DIAGNOSIS — Z6831 Body mass index (BMI) 31.0-31.9, adult: Secondary | ICD-10-CM | POA: Diagnosis not present

## 2022-03-26 DIAGNOSIS — H6122 Impacted cerumen, left ear: Secondary | ICD-10-CM | POA: Diagnosis not present

## 2022-03-26 DIAGNOSIS — I10 Essential (primary) hypertension: Secondary | ICD-10-CM | POA: Diagnosis not present

## 2022-03-26 DIAGNOSIS — E1169 Type 2 diabetes mellitus with other specified complication: Secondary | ICD-10-CM | POA: Diagnosis not present

## 2022-03-26 DIAGNOSIS — E669 Obesity, unspecified: Secondary | ICD-10-CM | POA: Diagnosis not present

## 2022-03-26 DIAGNOSIS — J4530 Mild persistent asthma, uncomplicated: Secondary | ICD-10-CM | POA: Diagnosis not present

## 2022-03-26 DIAGNOSIS — E7800 Pure hypercholesterolemia, unspecified: Secondary | ICD-10-CM | POA: Diagnosis not present

## 2022-03-26 DIAGNOSIS — E038 Other specified hypothyroidism: Secondary | ICD-10-CM | POA: Diagnosis not present

## 2022-03-27 DIAGNOSIS — E038 Other specified hypothyroidism: Secondary | ICD-10-CM | POA: Diagnosis not present

## 2022-03-27 DIAGNOSIS — I10 Essential (primary) hypertension: Secondary | ICD-10-CM | POA: Diagnosis not present

## 2022-03-27 DIAGNOSIS — E7800 Pure hypercholesterolemia, unspecified: Secondary | ICD-10-CM | POA: Diagnosis not present

## 2022-03-27 DIAGNOSIS — D649 Anemia, unspecified: Secondary | ICD-10-CM | POA: Diagnosis not present

## 2022-03-27 DIAGNOSIS — E1169 Type 2 diabetes mellitus with other specified complication: Secondary | ICD-10-CM | POA: Diagnosis not present

## 2022-04-24 IMAGING — CT CT RENAL STONE PROTOCOL
2 of 4 series · 16 of 46 positions shown, 18 images · non-contrast
Comparison: None.

CLINICAL DATA: Flank and left lower quadrant pain

EXAM:
CT ABDOMEN AND PELVIS WITHOUT CONTRAST
TECHNIQUE: Multidetector CT imaging of the abdomen and pelvis was performed
following the standard protocol without IV contrast.

[Series 2: axial st · axial · 0.79mm/px · z∈[-551,-146]mm · 13 of 93 slices shown, 15 images]
[im 6/93  soft-tissue]
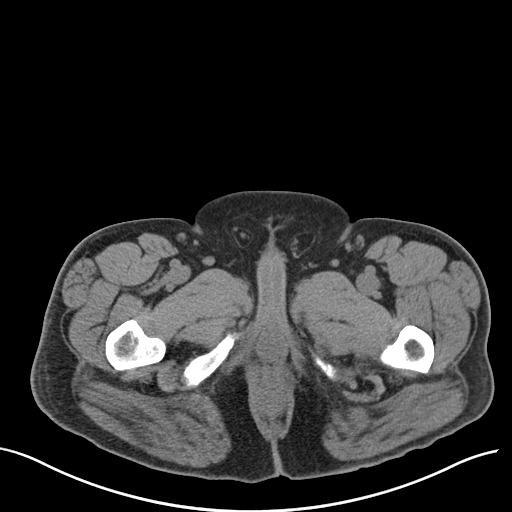
[im 6/93  bone]
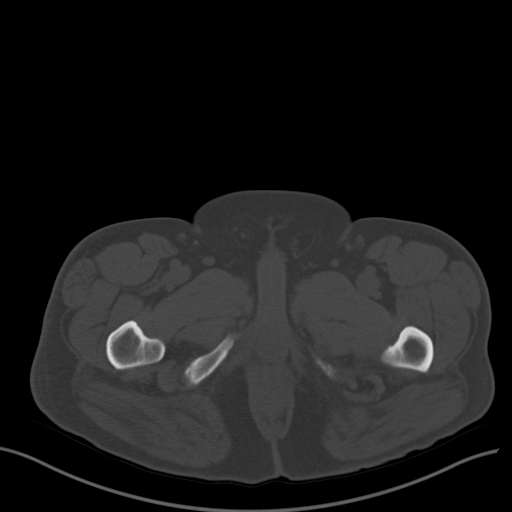
[im 11/93  soft-tissue]
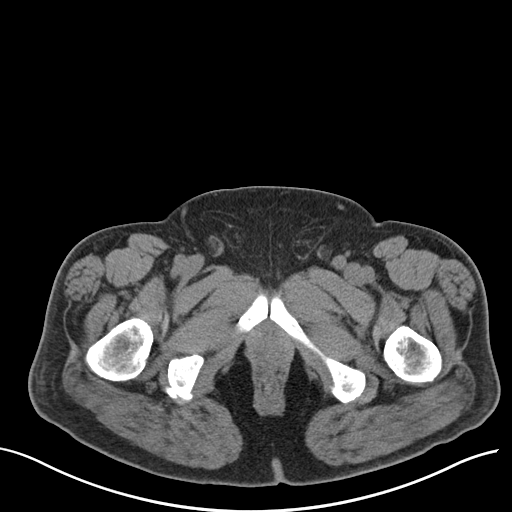
[im 21/93  soft-tissue]
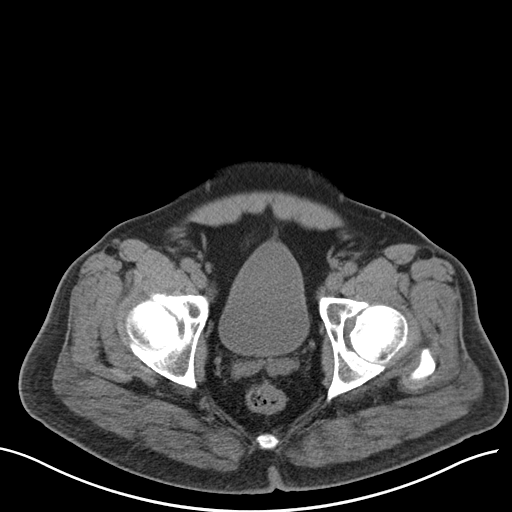
[im 26/93  soft-tissue]
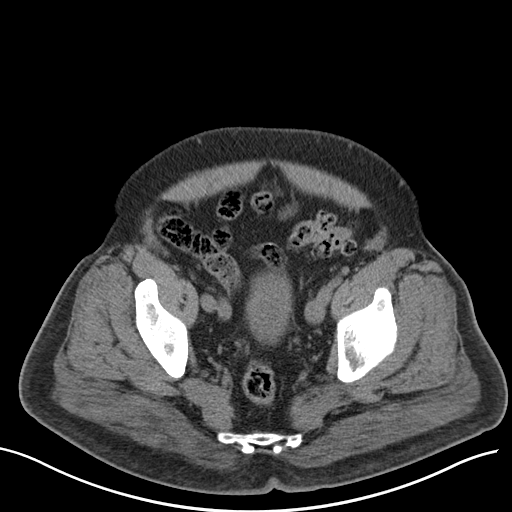
[im 31/93  soft-tissue]
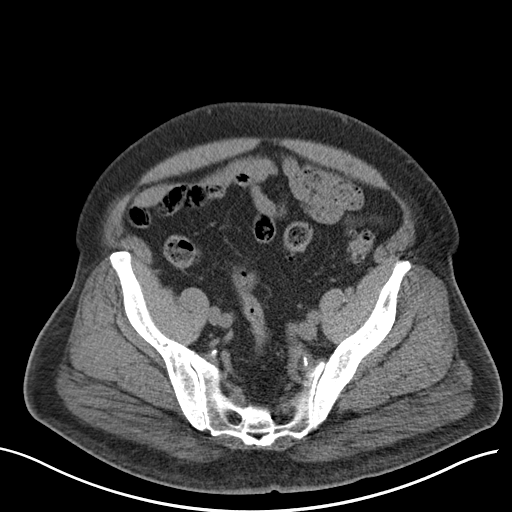
[im 41/93  soft-tissue]
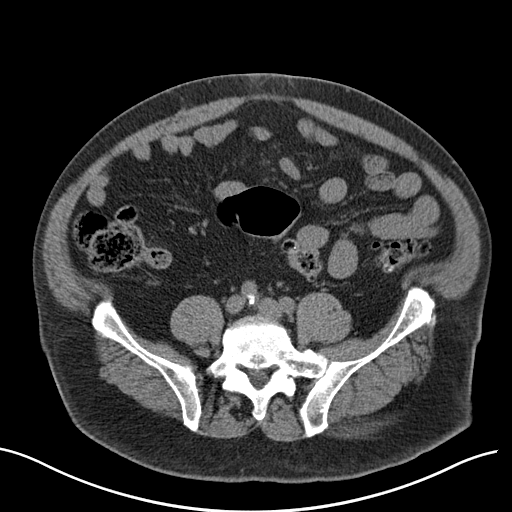
[im 47/93  soft-tissue]
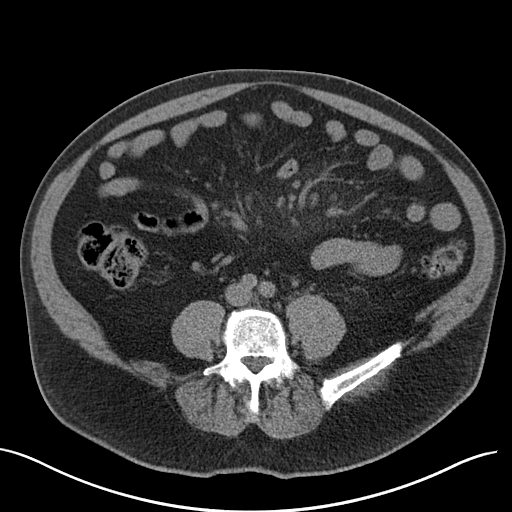
[im 52/93  soft-tissue]
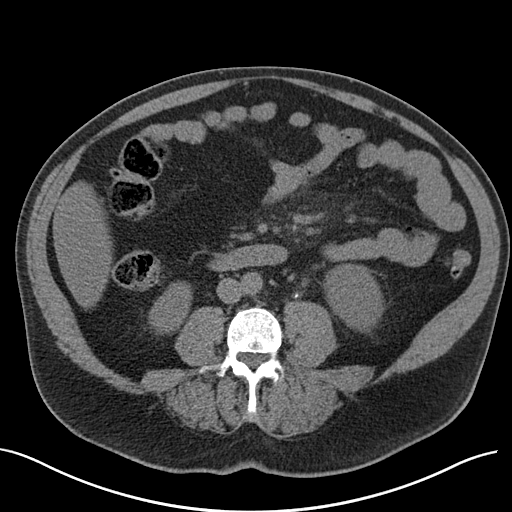
[im 62/93  soft-tissue]
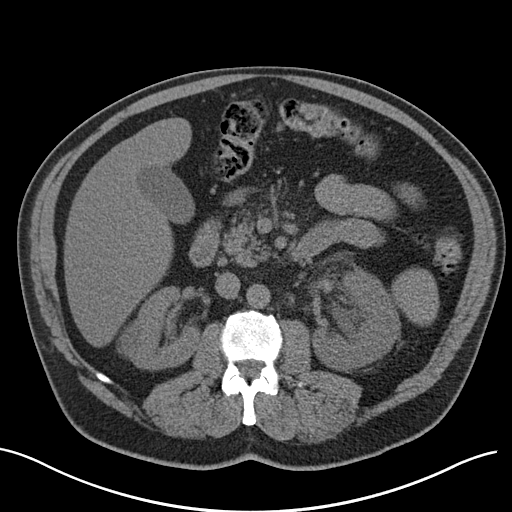
[im 62/93  bone]
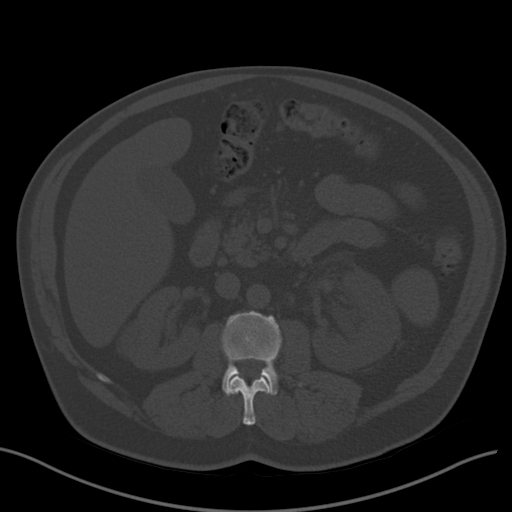
[im 67/93  soft-tissue]
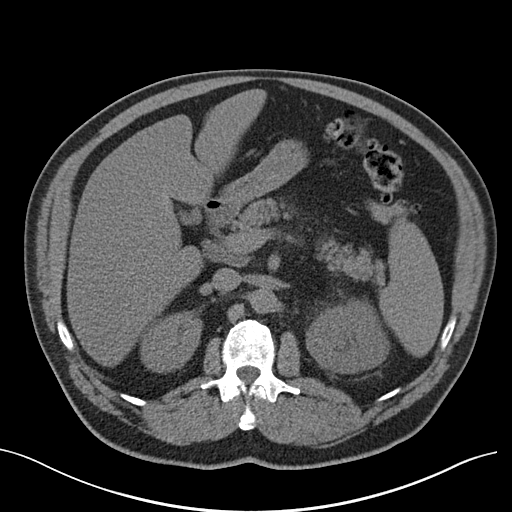
[im 72/93  soft-tissue]
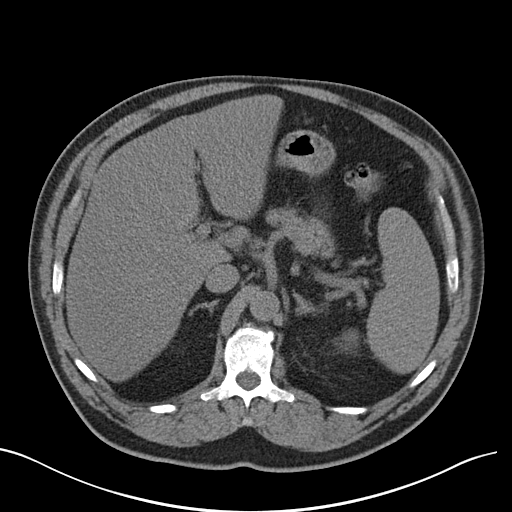
[im 82/93  soft-tissue]
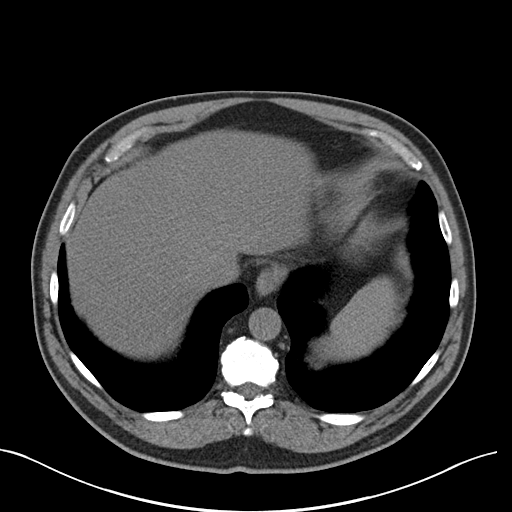
[im 87/93  soft-tissue]
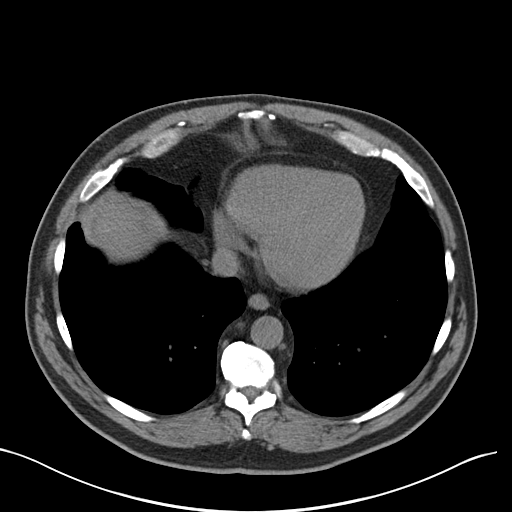

[Series 5: coronal · coronal · 0.86mm/px · 3 of 185 slices shown]
[im 62/185  soft-tissue]
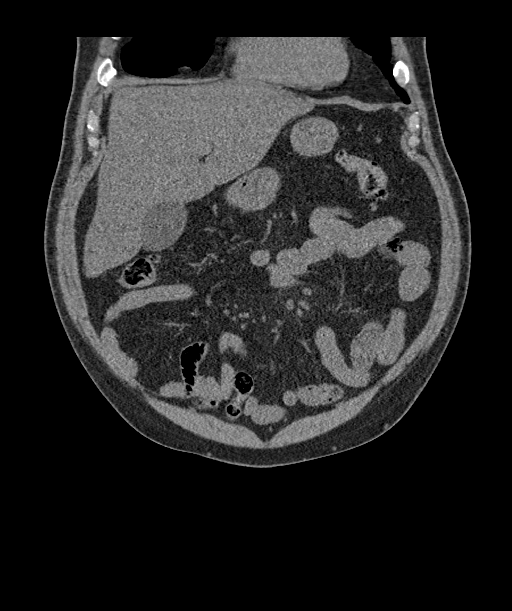
[im 82/185  soft-tissue]
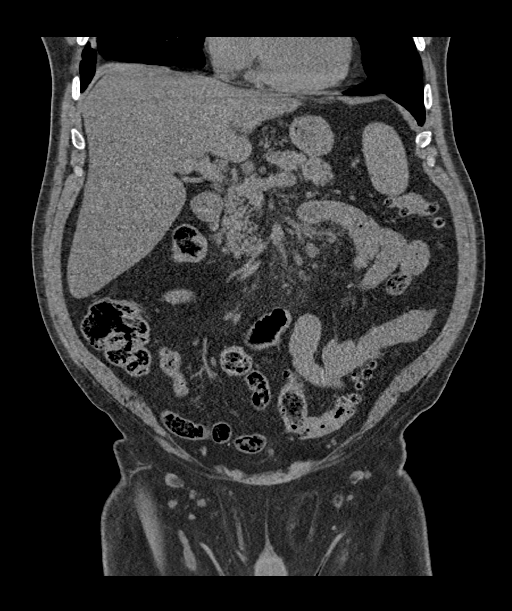
[im 103/185  soft-tissue]
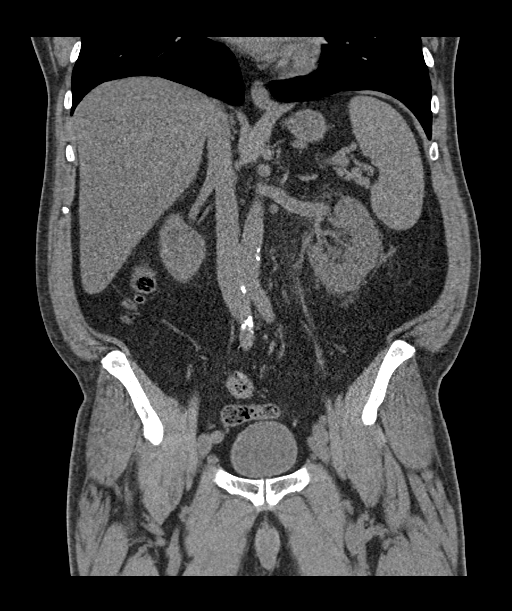

[16 of 46 positions shown; findings below may reference images not displayed]

FINDINGS: Lower chest: Lung bases demonstrate no acute consolidation or
effusion. Focal pleural and parenchymal scarring at the right middle
lobe. Normal cardiac size.

Hepatobiliary: No focal liver abnormality is seen. No gallstones,
gallbladder wall thickening, or biliary dilatation.

Pancreas: Unremarkable. No pancreatic ductal dilatation or
surrounding inflammatory changes.

Spleen: Normal in size without focal abnormality.

Adrenals/Urinary Tract: Adrenal glands are normal. Probable cyst in
the mid right kidney. Mild to moderate left hy hydronephrosis,
secondary to a 3 mm stone in the proximal left ureter about 3 cm
distal to the left UPJ. The urinary bladder is unremarkable.

Stomach/Bowel: Stomach is within normal limits. Appendix appears
normal. No evidence of bowel wall thickening, distention, or
inflammatory changes.

Vascular/Lymphatic: Mild aortic atherosclerosis. No aneurysm. Mild
hazy density within the central mesentery with multiple lymph nodes
which are subcentimeter.

Reproductive: Prostate is unremarkable.

Other: Negative for free air or free fluid. Moderate fat containing
umbilical hernia.

Musculoskeletal: No acute or significant osseous findings.
IMPRESSION: 1. Mild to moderate left hydronephrosis, secondary to a 3 mm stone
in the proximal left ureter about 3 cm distal to the left UPJ.
2. Moderate fat containing umbilical hernia.
3. Hazy infiltration of the central mesentery with multiple small
lymph nodes suggestive of mesenteritis or panniculitis.

Aortic Atherosclerosis ([id number]).

## 2022-05-02 DIAGNOSIS — Z6828 Body mass index (BMI) 28.0-28.9, adult: Secondary | ICD-10-CM | POA: Diagnosis not present

## 2022-05-02 DIAGNOSIS — F1920 Other psychoactive substance dependence, uncomplicated: Secondary | ICD-10-CM | POA: Diagnosis not present

## 2022-05-02 DIAGNOSIS — K429 Umbilical hernia without obstruction or gangrene: Secondary | ICD-10-CM | POA: Diagnosis not present

## 2022-05-02 DIAGNOSIS — Z0000 Encounter for general adult medical examination without abnormal findings: Secondary | ICD-10-CM | POA: Diagnosis not present

## 2022-05-02 DIAGNOSIS — Z23 Encounter for immunization: Secondary | ICD-10-CM | POA: Diagnosis not present

## 2022-06-29 DIAGNOSIS — E119 Type 2 diabetes mellitus without complications: Secondary | ICD-10-CM | POA: Diagnosis not present

## 2023-01-14 DIAGNOSIS — E663 Overweight: Secondary | ICD-10-CM | POA: Diagnosis not present

## 2023-01-14 DIAGNOSIS — I10 Essential (primary) hypertension: Secondary | ICD-10-CM | POA: Diagnosis not present

## 2023-01-14 DIAGNOSIS — Z125 Encounter for screening for malignant neoplasm of prostate: Secondary | ICD-10-CM | POA: Diagnosis not present

## 2023-01-14 DIAGNOSIS — E7800 Pure hypercholesterolemia, unspecified: Secondary | ICD-10-CM | POA: Diagnosis not present

## 2023-01-14 DIAGNOSIS — Z6827 Body mass index (BMI) 27.0-27.9, adult: Secondary | ICD-10-CM | POA: Diagnosis not present

## 2023-01-14 DIAGNOSIS — J4530 Mild persistent asthma, uncomplicated: Secondary | ICD-10-CM | POA: Diagnosis not present

## 2023-01-14 DIAGNOSIS — E038 Other specified hypothyroidism: Secondary | ICD-10-CM | POA: Diagnosis not present

## 2023-01-14 DIAGNOSIS — E1169 Type 2 diabetes mellitus with other specified complication: Secondary | ICD-10-CM | POA: Diagnosis not present

## 2023-01-14 DIAGNOSIS — Z1159 Encounter for screening for other viral diseases: Secondary | ICD-10-CM | POA: Diagnosis not present

## 2023-10-31 DIAGNOSIS — E038 Other specified hypothyroidism: Secondary | ICD-10-CM | POA: Diagnosis not present

## 2023-10-31 DIAGNOSIS — J4530 Mild persistent asthma, uncomplicated: Secondary | ICD-10-CM | POA: Diagnosis not present

## 2023-10-31 DIAGNOSIS — E7800 Pure hypercholesterolemia, unspecified: Secondary | ICD-10-CM | POA: Diagnosis not present

## 2023-10-31 DIAGNOSIS — E1169 Type 2 diabetes mellitus with other specified complication: Secondary | ICD-10-CM | POA: Diagnosis not present

## 2023-10-31 DIAGNOSIS — I10 Essential (primary) hypertension: Secondary | ICD-10-CM | POA: Diagnosis not present

## 2023-10-31 DIAGNOSIS — Z6827 Body mass index (BMI) 27.0-27.9, adult: Secondary | ICD-10-CM | POA: Diagnosis not present

## 2023-10-31 DIAGNOSIS — E663 Overweight: Secondary | ICD-10-CM | POA: Diagnosis not present

## 2024-03-09 DIAGNOSIS — Z5982 Transportation insecurity: Secondary | ICD-10-CM | POA: Diagnosis not present

## 2024-03-09 DIAGNOSIS — E038 Other specified hypothyroidism: Secondary | ICD-10-CM | POA: Diagnosis not present

## 2024-03-09 DIAGNOSIS — E7800 Pure hypercholesterolemia, unspecified: Secondary | ICD-10-CM | POA: Diagnosis not present

## 2024-03-09 DIAGNOSIS — Z560 Unemployment, unspecified: Secondary | ICD-10-CM | POA: Diagnosis not present

## 2024-03-09 DIAGNOSIS — Z91198 Patient's noncompliance with other medical treatment and regimen for other reason: Secondary | ICD-10-CM | POA: Diagnosis not present

## 2024-03-09 DIAGNOSIS — J4530 Mild persistent asthma, uncomplicated: Secondary | ICD-10-CM | POA: Diagnosis not present

## 2024-03-09 DIAGNOSIS — Z1159 Encounter for screening for other viral diseases: Secondary | ICD-10-CM | POA: Diagnosis not present

## 2024-03-09 DIAGNOSIS — F1920 Other psychoactive substance dependence, uncomplicated: Secondary | ICD-10-CM | POA: Diagnosis not present

## 2024-03-09 DIAGNOSIS — E1169 Type 2 diabetes mellitus with other specified complication: Secondary | ICD-10-CM | POA: Diagnosis not present

## 2024-03-09 DIAGNOSIS — I10 Essential (primary) hypertension: Secondary | ICD-10-CM | POA: Diagnosis not present
